# Patient Record
Sex: Female | Born: 1990 | Race: White | Hispanic: No | Marital: Single | Smoking: Former smoker
Health system: Southern US, Academic
[De-identification: ages and names within clinical notes are randomized; demographics above are authoritative.]

## PROBLEM LIST (undated history)

## (undated) DIAGNOSIS — K219 Gastro-esophageal reflux disease without esophagitis: Secondary | ICD-10-CM

## (undated) DIAGNOSIS — R011 Cardiac murmur, unspecified: Secondary | ICD-10-CM

## (undated) DIAGNOSIS — M25559 Pain in unspecified hip: Secondary | ICD-10-CM

## (undated) HISTORY — PX: TOTAL HIP ARTHROPLASTY: SHX124

## (undated) HISTORY — PX: HIP SURGERY: SHX245

## (undated) HISTORY — DX: Pain in unspecified hip: M25.559

---
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
## 2011-07-10 ENCOUNTER — Emergency Department: Payer: Self-pay | Admitting: Emergency Medicine

## 2011-07-14 ENCOUNTER — Emergency Department: Payer: Self-pay | Admitting: Emergency Medicine

## 2011-07-22 LAB — WOUND CULTURE

## 2013-09-11 ENCOUNTER — Emergency Department: Payer: Self-pay | Admitting: Emergency Medicine

## 2013-09-11 LAB — BASIC METABOLIC PANEL
Anion Gap: 6 — ABNORMAL LOW (ref 7–16)
BUN: 8 mg/dL (ref 7–18)
CALCIUM: 8.7 mg/dL (ref 8.5–10.1)
CREATININE: 0.65 mg/dL (ref 0.60–1.30)
Chloride: 108 mmol/L — ABNORMAL HIGH (ref 98–107)
Co2: 25 mmol/L (ref 21–32)
EGFR (African American): >60
EGFR (Non-African Amer.): >60
Glucose: 75 mg/dL (ref 65–99)
Osmolality: 275 (ref 275–301)
Potassium: 3.7 mmol/L (ref 3.5–5.1)
SODIUM: 139 mmol/L (ref 136–145)

## 2013-09-11 LAB — CBC
HCT: 39.6 % (ref 35.0–47.0)
HGB: 13.7 g/dL (ref 12.0–16.0)
MCH: 34.9 pg — ABNORMAL HIGH (ref 26.0–34.0)
MCHC: 34.5 g/dL (ref 32.0–36.0)
MCV: 101 fL — ABNORMAL HIGH (ref 80–100)
Platelet: 192 10*3/uL (ref 150–440)
RBC: 3.92 10*6/uL (ref 3.80–5.20)
RDW: 11.8 % (ref 11.5–14.5)
WBC: 14.2 10*3/uL — AB (ref 3.6–11.0)

## 2013-09-11 LAB — URINALYSIS, COMPLETE
Bacteria: NONE SEEN
Bilirubin,UR: NEGATIVE
Blood: NEGATIVE
Glucose,UR: NEGATIVE mg/dL (ref 0–75)
NITRITE: NEGATIVE
PH: 5 (ref 4.5–8.0)
Protein: NEGATIVE
RBC,UR: 2 /HPF (ref 0–5)
SPECIFIC GRAVITY: 1.01 (ref 1.003–1.030)
Squamous Epithelial: 15

## 2013-09-13 ENCOUNTER — Emergency Department: Payer: Self-pay | Admitting: Emergency Medicine

## 2013-09-16 LAB — CULTURE, BLOOD (SINGLE)

## 2013-09-19 LAB — WOUND CULTURE

## 2013-10-13 ENCOUNTER — Emergency Department: Payer: Self-pay | Admitting: Emergency Medicine

## 2017-08-29 ENCOUNTER — Encounter: Payer: Self-pay | Admitting: *Deleted

## 2017-08-29 ENCOUNTER — Other Ambulatory Visit: Payer: Self-pay

## 2017-08-29 ENCOUNTER — Emergency Department
Admission: EM | Admit: 2017-08-29 | Discharge: 2017-08-29 | Disposition: A | Discharge status: Home / Self Care | Payer: Medicare Other | Attending: Emergency Medicine | Admitting: Emergency Medicine

## 2017-08-29 DIAGNOSIS — F17200 Nicotine dependence, unspecified, uncomplicated: Secondary | ICD-10-CM | POA: Diagnosis not present

## 2017-08-29 DIAGNOSIS — Y998 Other external cause status: Secondary | ICD-10-CM | POA: Diagnosis not present

## 2017-08-29 DIAGNOSIS — T22291A Burn of second degree of multiple sites of right shoulder and upper limb, except wrist and hand, initial encounter: Secondary | ICD-10-CM

## 2017-08-29 DIAGNOSIS — X088XXA Exposure to other specified smoke, fire and flames, initial encounter: Secondary | ICD-10-CM | POA: Insufficient documentation

## 2017-08-29 DIAGNOSIS — Y9389 Activity, other specified: Secondary | ICD-10-CM | POA: Insufficient documentation

## 2017-08-29 DIAGNOSIS — T23231A Burn of second degree of multiple right fingers (nail), not including thumb, initial encounter: Secondary | ICD-10-CM | POA: Diagnosis not present

## 2017-08-29 DIAGNOSIS — W010XXA Fall on same level from slipping, tripping and stumbling without subsequent striking against object, initial encounter: Secondary | ICD-10-CM | POA: Diagnosis not present

## 2017-08-29 DIAGNOSIS — Y9289 Other specified places as the place of occurrence of the external cause: Secondary | ICD-10-CM | POA: Diagnosis not present

## 2017-08-29 DIAGNOSIS — S4981XA Other specified injuries of right shoulder and upper arm, initial encounter: Secondary | ICD-10-CM | POA: Diagnosis present

## 2017-08-29 LAB — BASIC METABOLIC PANEL
ANION GAP: 6 (ref 5–15)
BUN: 9 mg/dL (ref 6–20)
CALCIUM: 8.9 mg/dL (ref 8.9–10.3)
CO2: 27 mmol/L (ref 22–32)
CREATININE: 0.69 mg/dL (ref 0.44–1.00)
Chloride: 106 mmol/L (ref 101–111)
GFR calc Af Amer: >60 mL/min (ref >60)
GLUCOSE: 111 mg/dL — AB (ref 65–99)
Potassium: 3.8 mmol/L (ref 3.5–5.1)
Sodium: 139 mmol/L (ref 135–145)

## 2017-08-29 LAB — CBC WITH DIFFERENTIAL/PLATELET
BASOS ABS: 0 10*3/uL (ref 0–0.1)
Basophils Relative: 0 %
EOS PCT: 2 %
Eosinophils Absolute: 0.2 10*3/uL (ref 0–0.7)
HCT: 41.4 % (ref 35.0–47.0)
Hemoglobin: 14.5 g/dL (ref 12.0–16.0)
LYMPHS PCT: 20 %
Lymphs Abs: 1.7 10*3/uL (ref 1.0–3.6)
MCH: 36.2 pg — AB (ref 26.0–34.0)
MCHC: 35 g/dL (ref 32.0–36.0)
MCV: 103.4 fL — ABNORMAL HIGH (ref 80.0–100.0)
MONO ABS: 0.6 10*3/uL (ref 0.2–0.9)
MONOS PCT: 8 %
Neutro Abs: 5.8 10*3/uL (ref 1.4–6.5)
Neutrophils Relative %: 70 %
PLATELETS: 217 10*3/uL (ref 150–440)
RBC: 4 MIL/uL (ref 3.80–5.20)
RDW: 12.3 % (ref 11.5–14.5)
WBC: 8.3 10*3/uL (ref 3.6–11.0)

## 2017-08-29 MED ORDER — ONDANSETRON HCL 4 MG/2ML IJ SOLN
4.0000 mg | INTRAMUSCULAR | Status: AC
Start: 2017-08-29 — End: 2017-08-29
  Administered 2017-08-29: 4 mg via INTRAVENOUS
  Filled 2017-08-29: qty 2

## 2017-08-29 MED ORDER — CEFAZOLIN SODIUM-DEXTROSE 1-4 GM/50ML-% IV SOLN
1.0000 g | Freq: Once | INTRAVENOUS | Status: AC
  Administered 2017-08-29: 1 g via INTRAVENOUS
  Filled 2017-08-29: qty 50

## 2017-08-29 MED ORDER — LACTATED RINGERS IV BOLUS
1000.0000 mL | Freq: Once | INTRAVENOUS | Status: AC
  Administered 2017-08-29: 1000 mL via INTRAVENOUS
  Filled 2017-08-29: qty 1000

## 2017-08-29 MED ORDER — MORPHINE SULFATE (PF) 4 MG/ML IV SOLN
4.0000 mg | Freq: Once | INTRAVENOUS | Status: AC
  Administered 2017-08-29: 4 mg via INTRAVENOUS
  Filled 2017-08-29: qty 1

## 2017-08-29 MED ORDER — NICOTINE 21 MG/24HR TD PT24
21.0000 mg | MEDICATED_PATCH | Freq: Once | TRANSDERMAL | Status: DC
  Administered 2017-08-29: 21 mg via TRANSDERMAL
  Filled 2017-08-29: qty 1

## 2017-08-29 MED ORDER — TETANUS-DIPHTH-ACELL PERTUSSIS 5-2.5-18.5 LF-MCG/0.5 IM SUSP
0.5000 mL | Freq: Once | INTRAMUSCULAR | Status: AC
  Administered 2017-08-29: 0.5 mL via INTRAMUSCULAR
  Filled 2017-08-29: qty 0.5
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
## 2017-08-29 NOTE — ED Notes (Addendum)
Pt in NAD at time of transfer, VSS. PT denies any concerns regarding transfer to [facility]. PT signed electronic  consent  , see chart
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
## 2017-08-29 NOTE — ED Notes (Signed)
EMTALA REVIEWED 
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
## 2018-07-05 ENCOUNTER — Encounter (HOSPITAL_COMMUNITY): Payer: Self-pay

## 2018-07-05 ENCOUNTER — Emergency Department
Admission: EM | Admit: 2018-07-05 | Discharge: 2018-07-05 | Disposition: A | Discharge status: Home / Self Care | Payer: Self-pay | Attending: Emergency Medicine | Admitting: Emergency Medicine

## 2018-07-05 ENCOUNTER — Other Ambulatory Visit: Payer: Self-pay

## 2018-07-05 ENCOUNTER — Emergency Department (HOSPITAL_COMMUNITY): Payer: Self-pay

## 2018-07-05 DIAGNOSIS — T751XXA Unspecified effects of drowning and nonfatal submersion, initial encounter: Secondary | ICD-10-CM | POA: Insufficient documentation

## 2018-07-05 DIAGNOSIS — F17210 Nicotine dependence, cigarettes, uncomplicated: Secondary | ICD-10-CM | POA: Insufficient documentation

## 2018-07-05 HISTORY — DX: Cardiac murmur, unspecified: R01.1

## 2018-07-05 NOTE — ED Nurses Note (Signed)
Instructions to patient with verbalized understanding.  Home stable and ambulatory.

## 2018-07-05 NOTE — Discharge Instructions (Addendum)
Return to ER if any shortness of breath or symptoms of concern.
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
## 2020-01-27 ENCOUNTER — Other Ambulatory Visit (HOSPITAL_COMMUNITY): Payer: Self-pay | Admitting: NURSE PRACTITIONER

## 2020-01-27 ENCOUNTER — Ambulatory Visit
Admission: RE | Admit: 2020-01-27 | Discharge: 2020-01-27 | Disposition: A | Discharge status: Home / Self Care | Payer: Medicare Other | Source: Ambulatory Visit | Attending: NURSE PRACTITIONER | Admitting: NURSE PRACTITIONER

## 2020-01-27 ENCOUNTER — Other Ambulatory Visit: Payer: Self-pay

## 2020-01-27 DIAGNOSIS — M25551 Pain in right hip: Secondary | ICD-10-CM | POA: Insufficient documentation

## 2020-02-14 ENCOUNTER — Other Ambulatory Visit (HOSPITAL_COMMUNITY): Payer: Self-pay | Admitting: NURSE PRACTITIONER

## 2020-02-14 DIAGNOSIS — R079 Chest pain, unspecified: Secondary | ICD-10-CM

## 2020-02-21 ENCOUNTER — Ambulatory Visit
Admission: RE | Admit: 2020-02-21 | Discharge: 2020-02-21 | Disposition: A | Discharge status: Home / Self Care | Payer: Medicare Other | Source: Ambulatory Visit | Attending: NURSE PRACTITIONER | Admitting: NURSE PRACTITIONER

## 2020-02-21 ENCOUNTER — Other Ambulatory Visit: Payer: Self-pay

## 2020-02-21 DIAGNOSIS — R079 Chest pain, unspecified: Secondary | ICD-10-CM | POA: Insufficient documentation

## 2020-02-21 DIAGNOSIS — I340 Nonrheumatic mitral (valve) insufficiency: Secondary | ICD-10-CM | POA: Insufficient documentation

## 2020-02-25 ENCOUNTER — Ambulatory Visit (INDEPENDENT_AMBULATORY_CARE_PROVIDER_SITE_OTHER): Payer: Medicare Other

## 2020-03-03 ENCOUNTER — Other Ambulatory Visit (INDEPENDENT_AMBULATORY_CARE_PROVIDER_SITE_OTHER): Payer: Self-pay

## 2020-03-03 ENCOUNTER — Encounter (INDEPENDENT_AMBULATORY_CARE_PROVIDER_SITE_OTHER): Payer: Self-pay

## 2020-03-25 ENCOUNTER — Encounter (INDEPENDENT_AMBULATORY_CARE_PROVIDER_SITE_OTHER): Payer: Self-pay | Admitting: Physician Assistant

## 2020-03-25 ENCOUNTER — Telehealth (INDEPENDENT_AMBULATORY_CARE_PROVIDER_SITE_OTHER): Payer: Self-pay | Admitting: Physician Assistant

## 2020-03-25 ENCOUNTER — Ambulatory Visit: Payer: Medicare Other | Attending: Physician Assistant | Admitting: Physician Assistant

## 2020-03-25 ENCOUNTER — Other Ambulatory Visit: Payer: Self-pay

## 2020-03-25 VITALS — Ht 62.0 in | Wt 135.0 lb

## 2020-03-25 DIAGNOSIS — M25551 Pain in right hip: Secondary | ICD-10-CM

## 2020-03-25 DIAGNOSIS — G8929 Other chronic pain: Secondary | ICD-10-CM | POA: Insufficient documentation

## 2020-03-25 DIAGNOSIS — M1611 Unilateral primary osteoarthritis, right hip: Secondary | ICD-10-CM | POA: Insufficient documentation

## 2020-03-25 DIAGNOSIS — F17210 Nicotine dependence, cigarettes, uncomplicated: Secondary | ICD-10-CM | POA: Insufficient documentation

## 2020-03-25 NOTE — Telephone Encounter (Signed)
Called patient to let her know that her ultrasounded injection with Gerlean Ren is on 04/27/2020 at 1:30    2 wk f/up afer injection with Donna Harvey is on 05/12/2020    Appointment with Dr. Toni Amend to talk about a RT hip replacement is on 08/20/20 at 1:00

## 2020-04-01 ENCOUNTER — Encounter (INDEPENDENT_AMBULATORY_CARE_PROVIDER_SITE_OTHER): Payer: Self-pay | Admitting: Physician Assistant

## 2020-04-01 NOTE — Progress Notes (Signed)
Orthopaedics & Sports Medicine  [facility]  [facility]        NAME:  Donna Harvey  MRN:  [id number]  DOB:    20-Mar-1991  DATE:   04/01/2020    CHIEF COMPLAINT:  Chief Complaint              New Patient pain in Right hip            HPI:  Donna Harvey is a 29 y.o. female who presents today for evaluation of right hip pain.  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  She had multiple surgeries 37 is 29 years old [age] years old she states that she did have a surgery where screws were placed within the femoral head at some point in the knees were removed.  It appears from radiographs that her 2nd surgery was attachment of her abductors to the greater tuberosity.  She reports constant sharp aching pain rated 510 worse with walking and movement about the hip.  She utilizes a cane for mobility.  She has had ibuprofen in the past.  She has been followed with other orthopedic group some other areas.  She does not have any establish care locally.    PAST MEDICAL HISTORY:  There is no problem list on file for this patient.    PAST SURGICAL HISTORY:  Past Surgical History:   Procedure Laterality Date    HIP SURGERY Right      CURRENT MEDICATIONS:   Current Outpatient Medications   Medication Sig Dispense Refill    buprenorphine-naloxone (SUBOXONE) 8-2 mg Sublingual Film by Sublingual route Once a day      pantoprazole (PROTONIX) 20 mg Oral Tablet, Delayed Release (E.C.) Take 20 mg by mouth Every morning before breakfast      venlafaxine (EFFEXOR) 25 mg Oral Tablet Take 25 mg by mouth Once a day       No current facility-administered medications for this visit.     ALLERGIES:  Patient has no known allergies.  SOCIAL HISTORY:   Social History     Socioeconomic History    Marital status: Single     Spouse name: Not on file    Number of children: Not on file    Years of education: Not on file    Highest education level: Not on file   Tobacco Use    Smoking status: Current Every Day Smoker     Packs/day: 1.00     Types: Cigarettes     Smokeless tobacco: Never Used   Vaping Use    Vaping Use: Never used   Substance and Sexual Activity    Alcohol use: Yes     Comment: "just on the week    Drug use: Never     FAMILY HISTORY:  Family Medical History:    None           REVIEW OF SYSTEMS:    Constitutional: NEGATIVE for fever, chills, change in weight  Integumentary/ Skin: NEGATIVE for worrisome rashes, moles, or lesions  Musculoskeletal: As per HPI above  Neuro: NEGATIVE for weakness, dizziness, or paresthesias  All other systems reviewed and negative.      PHYSICAL EXAM:    Vital signs: Ht 1.575 m (5\' 2" )    Wt 61.2 kg (135 lb)    BMI 24.69 kg/m       General: Normal appearance, no obvious distress, alert and oriented to person, place, and time  HEENT: NC/AT, no scleral icterus  CV:  Leg are warm and well perfused   Pulm: Breathing non labored, normal respiratory pattern  Abdomen: Soft, non obese  Neuro: No focal neuro deficit, normal coordination, normal muscle tone  Skin: Warm and dry, no ecchymosis, erythema, warmth, or induration, no obvious rash  Psych: Appropriate, normal mood and affect    Musculoskeletal:   Examination of the right hip reveals positive log roll.  Positive Stinchfield test.  Significant decrease in range of motion in internal external rotation as well as flexion extension when compared to the contralateral side.  There is crepitus of the right hip through range of motion.  Sensation vascular and motor function intact distally.    IMAGING:     X-rays of the patient's right hip from 01/27/2020 were personally reviewed today by myself in the PACS system along with the radiology report.  Images reveal significant degenerative changes of the right humeral acetabular joint.  There is bone-on-bone contact.  There is evidence of previous surgery.  There is anchors present within the greater trochanter.  No acute fracture dislocation is noted..       ASSESSMENT/ PLAN:    Don was seen today for new patient.    Diagnoses and all  orders for this visit:    Osteoarthritis of right hip  -     Schedule Alasky/Yelinek Ultrasound Procedure; Future    Chronic right hip pain  -     Schedule Alasky/Yelinek Ultrasound Procedure; Future         Plan:  Did have a long discussion with the patient today regarding her right hip.  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  There is some deformity there the humeral head.  Unfortunately she has only 29 years old does have severe end-stage osteoarthritis.  We discussed treatment options including corticosteroid injections.  We did discuss with her the control her pain for least 3 months he has be a viable treatment options for her.  At some point in the future she will require arthroplasty of the right hip mostly for pain control.  We will schedule her with Dr. Drucilla Schmidt at [facility] for ultrasound-guided corticosteroid injections.  We will also place referral for Dr. Toni Amend of total joints at [facility].    Restrictions:none    No follow-ups on file.     Radiographs next visit: none          L. Fatima Sanger II PA-C  Orthopedics and Sports Medicine  [facility]    Note was read.  Assessment and plan is correct.  Any changes were noted.  JJF

## 2020-04-27 ENCOUNTER — Other Ambulatory Visit (HOSPITAL_BASED_OUTPATIENT_CLINIC_OR_DEPARTMENT_OTHER): Payer: Self-pay | Admitting: Sports Medicine

## 2020-05-11 ENCOUNTER — Telehealth (INDEPENDENT_AMBULATORY_CARE_PROVIDER_SITE_OTHER): Payer: Self-pay | Admitting: Physician Assistant

## 2020-05-11 NOTE — Telephone Encounter (Signed)
Patient was suppose to have a 2 wk f/up with Gala Romney after seeing Yelinek. Patient was a no show on 04/27/20. I called her and said that she would need to call [facility] office to get that rescheduled. I let the patient know that I would be canceling her apt with [place] tomorrow and that she would need to call us back when she got that apt with [facility] set back up.

## 2020-05-12 ENCOUNTER — Encounter (INDEPENDENT_AMBULATORY_CARE_PROVIDER_SITE_OTHER): Payer: Self-pay | Admitting: Physician Assistant

## 2020-08-11 ENCOUNTER — Other Ambulatory Visit (HOSPITAL_BASED_OUTPATIENT_CLINIC_OR_DEPARTMENT_OTHER): Payer: Self-pay | Admitting: Adult Reconstructive Orthopaedic Surgery

## 2020-08-11 DIAGNOSIS — M1611 Unilateral primary osteoarthritis, right hip: Secondary | ICD-10-CM

## 2020-08-20 ENCOUNTER — Ambulatory Visit (HOSPITAL_BASED_OUTPATIENT_CLINIC_OR_DEPARTMENT_OTHER): Payer: Self-pay | Admitting: Adult Reconstructive Orthopaedic Surgery

## 2021-09-09 ENCOUNTER — Emergency Department
Admission: EM | Admit: 2021-09-09 | Discharge: 2021-09-09 | Disposition: A | Discharge status: Home / Self Care | Payer: Medicare Other | Attending: Emergency Medicine | Admitting: Emergency Medicine

## 2021-09-09 ENCOUNTER — Encounter (HOSPITAL_COMMUNITY): Payer: Self-pay

## 2021-09-09 ENCOUNTER — Other Ambulatory Visit: Payer: Self-pay

## 2021-09-09 DIAGNOSIS — K0889 Other specified disorders of teeth and supporting structures: Secondary | ICD-10-CM | POA: Insufficient documentation

## 2021-09-09 DIAGNOSIS — Z87891 Personal history of nicotine dependence: Secondary | ICD-10-CM | POA: Insufficient documentation

## 2021-09-09 MED ORDER — AMOXICILLIN 250 MG CAPSULE
500.0000 mg | ORAL_CAPSULE | ORAL | Status: AC
Start: 2021-09-09 — End: 2021-09-09
  Administered 2021-09-09: 500 mg via ORAL
  Filled 2021-09-09: qty 2

## 2021-09-09 MED ORDER — AMOXICILLIN 500 MG CAPSULE
500.0000 mg | ORAL_CAPSULE | Freq: Two times a day (BID) | ORAL | 0 refills | Status: AC
Start: 2021-09-09 — End: 2021-09-19

## 2021-09-09 NOTE — Discharge Instructions (Signed)
Follow up your primary care provider for recheck and return for increasing symptoms or new, concerning symptoms develop.    Over-the-counter acetaminophen or ibuprofen for pain control as needed  Ice to right side of the face for comfort measure  Call your dentist to arrange for follow-up with continued symptoms

## 2021-09-09 NOTE — ED Triage Notes (Signed)
Had wisdom teeth removed last Monday. Pain ever since,continuing to get worse. Pain on right side jaw and into ear.

## 2021-09-09 NOTE — ED Provider Notes (Signed)
Donna Harvey  [birth year]  31 y.o.  female  Elmon Kirschner, NP    Chief Complaint:   Chief Complaint   Patient presents with   . Facial Pain       HPI: This is a 31 y.o. female who presents to the emergency department patient is postop day 10 from a right lower wisdom tooth removal patient states still having some pain and will feel pain up into her right ear prompted ED visit tonight she has no fever no cough no difficulty swallowing noted no difficulty moving her neck no meningeal signs and symptoms presented.  Patient states he was just concerned because she still having the pain and does not see her dentist        [place] Pain Rating Scale     On a scale of 0-10, during the past 24 hours, pain has interfered with you usual activity:       On a scale of 0-10, during the past 24 hours, pain has interfered with your sleep:      On a scale of 0-10, during the past 24 hours, pain has affected your mood:       On a scale of 0-10, during the past 24 hours, pain has contributed to your stress:       On a scale of 0-10, what is your overall pain Rating: 8          Past Medical History:   Past Medical History:   Diagnosis Date   . Heart murmur    . Hip pain      (Not in an outpatient encounter)     Past Surgical History:   Past Surgical History:   Procedure Laterality Date   . Hip surgery Right          Home Medications  Medications Prior to Admission     Prescriptions    CLINDAMYCIN HCL ORAL    Take by mouth    PREDNISONE ORAL    Take by mouth          Social History:   Social History     Tobacco Use   . Smoking status: Former     Packs/day: 1.00     Types: Cigarettes   . Smokeless tobacco: Never   Vaping Use   . Vaping Use: Never used   Substance Use Topics   . Alcohol use: Not Currently     Comment: "just on the week   . Drug use: Never      Social History     Substance and Sexual Activity   Drug Use Never       Allergies: No Known Allergies    Review of systoms  Constitutional: Negative for fever. Negative for  chills.   Skin: Negative for rash or other lesions.   HENT: Negative for headaches.   Eyes: Negative for blurred vision and double vision.   Cardiovascular: Negative for chest pain and palpitations.   Respiratory: Negative for cough. Is not experiencing shortness of breath. No wheezing.  Gastrointestinal: Negative for nausea, vomiting and abdominal pain. No diarrhea, constipation or hematochezia  Genitourinary: Negative for dysuria, urgency, frequency, or hematuria.   Musculoskeletal: Negative for neck pain and back pain.   Neurological: Negative for dizziness and loss of consciousness.  No numbness, tingling, or other sensation changed.  Psych:  Denies anxiety or depression.  All other systems reviewed and are negative.       BP 138/89   Pulse Marland Kitchen)  101   Temp 37.3 C (99.1 F)   Resp 16   Ht 1.575 m (5\' 2" )   Wt 65.8 kg (145 lb)   LMP 09/09/2021 (Approximate)   SpO2 97%   BMI 26.52 kg/m         Physical Exam  Vitals and nursing note reviewed.   Constitutional:       Appearance: Normal appearance.   HENT:      Head: Normocephalic and atraumatic.      Right Ear: Tympanic membrane, ear canal and external ear normal.      Left Ear: Tympanic membrane, ear canal and external ear normal.      Nose: Nose normal.      Mouth/Throat:      Mouth: Mucous membranes are moist.      Pharynx: Oropharynx is clear.      Comments: No dental pain appreciated.  Patient does have open socket from removal right posterior wisdom tooth is no surrounding gum erythema no acute drainage noted no acute jaw swelling to suggest cellulitis  Eyes:      Extraocular Movements: Extraocular movements intact.      Pupils: Pupils are equal, round, and reactive to light.   Musculoskeletal:      Cervical back: Normal range of motion and neck supple. No rigidity.   Lymphadenopathy:      Cervical: No cervical adenopathy.   Neurological:      Mental Status: She is alert. Mental status is at baseline.   Psychiatric:         Mood and Affect: Mood  normal.       MDM:   During the patient's stay in the emergency department, the above listed imaging and/or labs were performed to assist with medical decision making and were reviewed by myself when available for review.   The following orders were placed after examining the patient :  Orders Placed This Encounter   . amoxicillin (AMOXIL) capsule   . amoxicillin (AMOXIL) 500 mg Oral Capsule      No orders to display      No results found for this or any previous visit (from the past 12 hour(s)).    Medical decision making:  ED Course:    ED Course as of 09/09/21 2206   Thu Sep 09, 2021   2204 Differential to include sepsis meningitis otitis media TMJ dental caries dental abscess    Normal postop exam does not suggest acute infection at this time empiric antibiotics provided ice   2205 To the jaw      Procedures              Amount and/or Complexity of Data Reviewed  Labs: ordered. Decision-making details documented in ED Course.  Radiology: ordered and independent interpretation performed. Decision-making details documented in ED Course.  ECG/medicine tests: ordered and independent interpretation performed.        Risk  OTC drugs.  Prescription drug management.  Parenteral controlled substances.         Findigns and diagnosis discussed with patient.   Patient given opportunity to ask questions about ED visit and treatment plan  Patient was involved in the medical decision making and treatment plan at this ED visit  Clinical Impression:   Clinical Impression   Pain, dental (Primary)       Critical Care Time:  Total critical care time spent in direct care of this patient at high risk based on presenting history/exam/and complaint, including initial evaluation and stabilization,  review of data, re-examination, discussion with admitting and consulting services to arrange definitive care, and exclusive of any procedures performed,       Disposition: Discharged  New Prescriptions    AMOXICILLIN (AMOXIL) 500 MG ORAL  CAPSULE    Take 1 Capsule (500 mg total) by mouth Twice daily for 10 days      [facility] [place]  [place] [phone]  [phone]    As needed     Joaquim Lai, DO     This note was completed with dictation software. I personally have reviewed this note, but there is always a possibility of dictation errors.  This chart reflects my personal interaction with the patient and is a reflection of my history and physical, the patient's chief complaint to me and ROS as discussed with the patient at the bedside. This does not always reflect the triage "chief complaint" which has been reviewed by me and with additional history elicited during my exam may have changed after a more thorough evaluation.?It also adds clarity to documentation terms used by nursing further clarified by self which are more medically accurate in description after discussion.  This note was partially created using voice recognition software and is inherently subject to errors including those of syntax and "sound alike " substitutions which may escape proof reading.  In such instances, original meaning may be extrapolated by contextual derivation.

## 2021-09-09 NOTE — ED Nurses Note (Signed)
Patient discharged home with family. Patient alert, oriented, stable. AVS reviewed with patient/care giver.  A written copy of the AVS and discharge instructions was given to the patient/care giver. Escripts sent to preferred pharmacy with instruction to pick up in the AM. Questions sufficiently answered as needed.  Patient/care giver encouraged to follow up with PCP as indicated.  In the event of an emergency, patient/care giver instructed to call 911 or go to the nearest emergency room. Patient declined wheelchair, able to ambulate out of ED without issue.

## 2022-02-07 ENCOUNTER — Encounter (HOSPITAL_COMMUNITY): Payer: Self-pay

## 2024-03-30 ENCOUNTER — Emergency Department (HOSPITAL_COMMUNITY)

## 2024-03-30 ENCOUNTER — Encounter (HOSPITAL_COMMUNITY): Payer: Self-pay

## 2024-03-30 ENCOUNTER — Other Ambulatory Visit: Payer: Self-pay

## 2024-03-30 ENCOUNTER — Emergency Department (HOSPITAL_COMMUNITY)
Admission: EM | Admit: 2024-03-30 | Discharge: 2024-03-30 | Disposition: A | Discharge status: Home / Self Care | Attending: Emergency Medicine | Admitting: Emergency Medicine

## 2024-03-30 DIAGNOSIS — F17210 Nicotine dependence, cigarettes, uncomplicated: Secondary | ICD-10-CM | POA: Diagnosis not present

## 2024-03-30 DIAGNOSIS — K219 Gastro-esophageal reflux disease without esophagitis: Secondary | ICD-10-CM | POA: Diagnosis not present

## 2024-03-30 DIAGNOSIS — R1110 Vomiting, unspecified: Secondary | ICD-10-CM | POA: Diagnosis present

## 2024-03-30 HISTORY — DX: Gastro-esophageal reflux disease without esophagitis: K21.9

## 2024-03-30 MED ORDER — LIDOCAINE VISCOUS HCL 2 % MT SOLN
15.0000 mL | Freq: Once | OROMUCOSAL | Status: AC
  Administered 2024-03-30: 15 mL via ORAL
  Filled 2024-03-30: qty 15

## 2024-03-30 MED ORDER — ALUM & MAG HYDROXIDE-SIMETH 200-200-20 MG/5ML PO SUSP
30.0000 mL | Freq: Once | ORAL | Status: AC
  Administered 2024-03-30: 30 mL via ORAL
  Filled 2024-03-30: qty 30

## 2024-03-30 MED ORDER — SUCRALFATE 1 GM/10ML PO SUSP
1.0000 g | Freq: Once | ORAL | Status: AC
  Administered 2024-03-30: 1 g via ORAL
  Filled 2024-03-30: qty 10

## 2024-03-30 MED ORDER — PANTOPRAZOLE SODIUM 40 MG PO TBEC: 40.0000 mg | DELAYED_RELEASE_TABLET | Freq: Every day | ORAL | 3 refills | Status: AC

## 2024-03-30 MED ORDER — SUCRALFATE 1 G PO TABS: 1.0000 g | ORAL_TABLET | Freq: Three times a day (TID) | ORAL | 0 refills | Status: AC

## 2024-03-30 NOTE — ED Provider Notes (Signed)
 " [place] EMERGENCY DEPARTMENT AT [facility] Provider Note   CSN: [id number] Arrival date & time: 03/30/24  8090     Patient presents with: Gastroesophageal Reflux   Michele Franco is a 33 y.o. female.  She has a longstanding history of reflux her whole life.  Complaining of worsening symptoms over the last week after regurgitated acid up.  Has been trying Tums without improvement.  Had been on another medication in the past but it did not help so does not take it.  Has never seen GI for this.  Does smoke cigarettes and infrequent alcohol.  {Add pertinent medical, surgical, social history, OB history to YEP:67052} The history is provided by the patient.  Gastroesophageal Reflux This is a chronic problem. The current episode started more than 1 week ago. The problem occurs constantly. The problem has not changed since onset.Associated symptoms include chest pain and abdominal pain. Pertinent negatives include no headaches and no shortness of breath. The symptoms are aggravated by eating. Nothing relieves the symptoms. Treatments tried: tums. The treatment provided no relief.       Prior to Admission medications  Not on File    Allergies: Patient has no known allergies.    Review of Systems  Constitutional:  Negative for fever.  Respiratory:  Negative for shortness of breath.   Cardiovascular:  Positive for chest pain.  Gastrointestinal:  Positive for abdominal pain.  Neurological:  Negative for headaches.    Updated Vital Signs BP 114/83 (BP Location: Right Arm)   Pulse (!) 101   Temp 98.6 F (37 C)   Resp 16   Wt 61.2 kg   SpO2 97%   BMI 23.91 kg/m   Physical Exam Vitals and nursing note reviewed.  Constitutional:      General: She is not in acute distress.    Appearance: Normal appearance. She is well-developed.  HENT:     Head: Normocephalic and atraumatic.  Eyes:     Conjunctiva/sclera: Conjunctivae normal.  Cardiovascular:     Rate and  Rhythm: Normal rate and regular rhythm.     Heart sounds: No murmur heard. Pulmonary:     Effort: Pulmonary effort is normal. No respiratory distress.     Breath sounds: Normal breath sounds. No stridor. No wheezing.  Abdominal:     Palpations: Abdomen is soft.     Tenderness: There is no abdominal tenderness. There is no guarding or rebound.  Musculoskeletal:        General: No deformity.     Cervical back: Neck supple.  Skin:    General: Skin is warm and dry.  Neurological:     General: No focal deficit present.     Mental Status: She is alert.     GCS: GCS eye subscore is 4. GCS verbal subscore is 5. GCS motor subscore is 6.     (all labs ordered are listed, but only abnormal results are displayed) Labs Reviewed - No data to display  EKG: None  Radiology: No results found.  {Document cardiac monitor, telemetry assessment procedure when appropriate:32947} Procedures   Medications Ordered in the ED  alum & mag hydroxide-simeth (MAALOX/MYLANTA) 200-200-20 MG/5ML suspension 30 mL (has no administration in time range)    And  lidocaine  (XYLOCAINE ) 2 % viscous mouth solution 15 mL (has no administration in time range)  sucralfate  (CARAFATE ) 1 GM/10ML suspension 1 g (has no administration in time range)      {Click here for ABCD2, HEART and other  calculators REFRESH Note before signing:1}                              Medical Decision Making Amount and/or Complexity of Data Reviewed Radiology: ordered.  Risk OTC drugs. Prescription drug management.   This patient complains of ***; this involves an extensive number of treatment Options and is a complaint that carries with it a high risk of complications and morbidity. The differential includes ***  I ordered, reviewed and interpreted labs, which included *** I ordered medication *** and reviewed PMP when indicated. I ordered imaging studies which included *** and I independently    visualized and interpreted  imaging which showed *** Additional history obtained from *** Previous records obtained and reviewed *** I consulted *** and discussed lab and imaging findings and discussed disposition.  Cardiac monitoring reviewed, *** Social determinants considered, *** Critical Interventions: ***  After the interventions stated above, I reevaluated the patient and found *** Admission and further testing considered, ***   {Document critical care time when appropriate  Document review of labs and clinical decision tools ie CHADS2VASC2, etc  Document your independent review of radiology images and any outside records  Document your discussion with family members, caretakers and with consultants  Document social determinants of health affecting pt's care  Document your decision making why or why not admission, treatments were needed:32947:::1}   Final diagnoses:  None    ED Discharge Orders     None        "

## 2024-03-30 NOTE — Discharge Instructions (Addendum)
 Limit tobacco and alcohol use.  No ibuprofen Aleve.  Avoid late meals and overeating.

## 2024-03-30 NOTE — ED Triage Notes (Signed)
 Pt reports she had acid reflux and vomited some acid and now has burning pain from her throat down her entire esophagus.  Pt reports she is supposed to take medicine for GERD but does not because she feels like it does not help.  Pt reports she can not eat or drink because it hurts her throat and esophagus.

## 2024-04-29 ENCOUNTER — Encounter: Payer: Self-pay | Admitting: Internal Medicine
